# Patient Record
Sex: Male | Born: 2000 | Race: White | Hispanic: No | Marital: Single | Smoking: Never smoker
Health system: Southern US, Community
[De-identification: ages and names within clinical notes are randomized; demographics above are authoritative.]

---
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
## 2021-05-23 ENCOUNTER — Other Ambulatory Visit: Payer: Self-pay

## 2021-05-23 ENCOUNTER — Ambulatory Visit: Payer: BLUE CROSS/BLUE SHIELD | Admitting: Family Medicine

## 2021-05-23 ENCOUNTER — Encounter: Payer: Self-pay | Admitting: Family Medicine

## 2021-05-23 VITALS — BP 120/72 | HR 86 | Temp 98.1°F | Ht 74.0 in | Wt 169.2 lb

## 2021-05-23 DIAGNOSIS — J029 Acute pharyngitis, unspecified: Secondary | ICD-10-CM

## 2021-05-23 DIAGNOSIS — J0301 Acute recurrent streptococcal tonsillitis: Secondary | ICD-10-CM | POA: Diagnosis not present

## 2021-05-23 LAB — POCT RAPID STREP A (OFFICE): Rapid Strep A Screen: POSITIVE — AB

## 2021-05-23 MED ORDER — AMOXICILLIN 875 MG PO TABS: 875.0000 mg | ORAL_TABLET | Freq: Two times a day (BID) | ORAL | 0 refills | Status: AC

## 2021-05-23 NOTE — Patient Instructions (Signed)
It was very nice to see you today!  You have strep throat.  We will start the amoxicillin.  We will place referral for you to see an ear nose and throat doctor.  Take care, Dr Jimmey Ralph  PLEASE NOTE:  If you had any lab tests please let us know if you have not heard back within a few days. You may see your results on mychart before we have a chance to review them but we will give you a call once they are reviewed by [place]. If we ordered any referrals today, please let us know if you have not heard from their office within the next week.   Please try these tips to maintain a healthy lifestyle:  Eat at least 3 REAL meals and 1-2 snacks per day.  Aim for no more than 5 hours between eating.  If you eat breakfast, please do so within one hour of getting up.   Each meal should contain half fruits/vegetables, one quarter protein, and one quarter carbs (no bigger than a computer mouse)  Cut down on sweet beverages. This includes juice, soda, and sweet tea.   Drink at least 1 glass of water with each meal and aim for at least 8 glasses per day  Exercise at least 150 minutes every week.

## 2021-05-23 NOTE — Progress Notes (Signed)
   Scott Schmitt is a 20 y.o. male who presents today for an office visit.  Assessment/Plan:  Strep pharyngitis Rapid strep positive.  Given that this is his fourth episode in the last few months we will place referral to ENT for further evaluation.  We will treat today with course of amoxicillin.  He has done well with this in the past.  He can use over-the-counter analgesics as needed.     Subjective:  HPI: He complains of  recurrent swollen tonsils for the past several months. Symptoms started again a few days ago. He admits to post nasal drip, and denies the following: heart burn, fever, chills, and sweats.   Also, he admits to having palpably swollen neck lymph nodes. The first two instances of this sickness appeared to be viral;  the most recent seems to be bacterial and was given amoxicillin for it.   Has had 3 similar episodes in the past few months.       Objective:  Physical Exam: There were no vitals taken for this visit.  Gen: No acute distress, resting comfortably CV: Regular rate and rhythm with no murmurs appreciated Pulm: Normal work of breathing, clear to auscultation bilaterally with no crackles, wheezes, or rhonchi Neuro: Grossly normal, moves all extremities Psych: Normal affect and thought content HEENT: Op erythematous.  Bilateral tonsillar hypertrophy.exudates noted. Submandibular LAD present.       I,Jordan Kelly,acting as a Neurosurgeon for Jacquiline Doe, MD.,have documented all relevant documentation on the behalf of Jacquiline Doe, MD,as directed by  Jacquiline Doe, MD while in the presence of Jacquiline Doe, MD.   I, Jacquiline Doe, MD, have reviewed all documentation for this visit. The documentation on 05/23/21 for the exam, diagnosis, procedures, and orders are all accurate and complete.  Katina Degree. Jimmey Ralph, MD 05/23/2021 7:55 AM
